# Patient Record
Sex: Male | Born: 1955 | Hispanic: No | Marital: Single
Health system: Southern US, Community
[De-identification: ages and names within clinical notes are randomized; demographics above are authoritative.]

---

## 1999-06-18 ENCOUNTER — Emergency Department (HOSPITAL_COMMUNITY): Admission: EM | Admit: 1999-06-18 | Discharge: 1999-06-18 | Payer: Self-pay | Admitting: Emergency Medicine

## 1999-06-18 ENCOUNTER — Encounter: Payer: Self-pay | Admitting: General Surgery

## 2000-01-30 ENCOUNTER — Encounter: Payer: Self-pay | Admitting: Emergency Medicine

## 2000-01-30 ENCOUNTER — Emergency Department (HOSPITAL_COMMUNITY): Admission: EM | Admit: 2000-01-30 | Discharge: 2000-01-30 | Payer: Self-pay | Admitting: Emergency Medicine

## 2003-09-30 ENCOUNTER — Emergency Department (HOSPITAL_COMMUNITY): Admission: EM | Admit: 2003-09-30 | Discharge: 2003-09-30 | Payer: Self-pay | Admitting: Emergency Medicine

## 2003-10-24 ENCOUNTER — Ambulatory Visit (HOSPITAL_COMMUNITY): Admission: RE | Admit: 2003-10-24 | Discharge: 2003-10-24 | Payer: Self-pay | Admitting: Surgery

## 2008-08-21 ENCOUNTER — Encounter: Admission: RE | Admit: 2008-08-21 | Discharge: 2008-08-21 | Payer: Self-pay | Admitting: Family Medicine

## 2009-02-19 ENCOUNTER — Encounter (INDEPENDENT_AMBULATORY_CARE_PROVIDER_SITE_OTHER): Payer: Self-pay | Admitting: General Surgery

## 2009-02-19 ENCOUNTER — Inpatient Hospital Stay (HOSPITAL_COMMUNITY): Admission: EM | Admit: 2009-02-19 | Discharge: 2009-02-22 | Payer: Self-pay | Admitting: Emergency Medicine

## 2010-10-12 LAB — GLUCOSE, CAPILLARY
Glucose-Capillary: 133 mg/dL — ABNORMAL HIGH (ref 70–99)
Glucose-Capillary: 141 mg/dL — ABNORMAL HIGH (ref 70–99)
Glucose-Capillary: 146 mg/dL — ABNORMAL HIGH (ref 70–99)
Glucose-Capillary: 147 mg/dL — ABNORMAL HIGH (ref 70–99)
Glucose-Capillary: 148 mg/dL — ABNORMAL HIGH (ref 70–99)
Glucose-Capillary: 166 mg/dL — ABNORMAL HIGH (ref 70–99)
Glucose-Capillary: 169 mg/dL — ABNORMAL HIGH (ref 70–99)

## 2010-10-12 LAB — CULTURE, BLOOD (ROUTINE X 2)
Culture: NO GROWTH
Culture: NO GROWTH

## 2010-10-12 LAB — URINALYSIS, ROUTINE W REFLEX MICROSCOPIC
Glucose, UA: 100 mg/dL — AB
Ketones, ur: 15 mg/dL — AB
Leukocytes, UA: NEGATIVE
Nitrite: NEGATIVE
Specific Gravity, Urine: 1.038 — ABNORMAL HIGH (ref 1.005–1.030)
pH: 7.5 (ref 5.0–8.0)

## 2010-10-12 LAB — URINE MICROSCOPIC-ADD ON

## 2010-10-12 LAB — BASIC METABOLIC PANEL
BUN: 10 mg/dL (ref 6–23)
CO2: 29 mEq/L (ref 19–32)
Chloride: 104 mEq/L (ref 96–112)
Creatinine, Ser: 1.09 mg/dL (ref 0.4–1.5)
Potassium: 3.6 mEq/L (ref 3.5–5.1)

## 2010-10-12 LAB — CBC
HCT: 34.7 % — ABNORMAL LOW (ref 39.0–52.0)
Hemoglobin: 11.6 g/dL — ABNORMAL LOW (ref 13.0–17.0)
MCHC: 34.9 g/dL (ref 30.0–36.0)
MCHC: 35.5 g/dL (ref 30.0–36.0)
MCV: 91.6 fL (ref 78.0–100.0)
MCV: 91.9 fL (ref 78.0–100.0)
Platelets: 100 10*3/uL — ABNORMAL LOW (ref 150–400)
RBC: 3.62 MIL/uL — ABNORMAL LOW (ref 4.22–5.81)
RBC: 3.78 MIL/uL — ABNORMAL LOW (ref 4.22–5.81)
RDW: 12.7 % (ref 11.5–15.5)
WBC: 5.2 10*3/uL (ref 4.0–10.5)

## 2010-10-13 LAB — CBC
HCT: 44.5 % (ref 39.0–52.0)
MCHC: 34.3 g/dL (ref 30.0–36.0)
MCV: 91.8 fL (ref 78.0–100.0)
Platelets: 161 10*3/uL (ref 150–400)
RDW: 13.2 % (ref 11.5–15.5)

## 2010-10-13 LAB — COMPREHENSIVE METABOLIC PANEL
AST: 25 U/L (ref 0–37)
Albumin: 3.8 g/dL (ref 3.5–5.2)
BUN: 10 mg/dL (ref 6–23)
Calcium: 9 mg/dL (ref 8.4–10.5)
Creatinine, Ser: 1.02 mg/dL (ref 0.4–1.5)
GFR calc Af Amer: >60 mL/min (ref >60)
Total Bilirubin: 2 mg/dL — ABNORMAL HIGH (ref 0.3–1.2)
Total Protein: 6.8 g/dL (ref 6.0–8.3)

## 2010-10-13 LAB — DIFFERENTIAL
Basophils Absolute: 0 10*3/uL (ref 0.0–0.1)
Lymphocytes Relative: 9 % — ABNORMAL LOW (ref 12–46)
Lymphs Abs: 0.8 10*3/uL (ref 0.7–4.0)
Monocytes Absolute: 0.5 10*3/uL (ref 0.1–1.0)
Monocytes Relative: 5 % (ref 3–12)
Neutro Abs: 8 10*3/uL — ABNORMAL HIGH (ref 1.7–7.7)

## 2010-11-19 NOTE — Op Note (Signed)
NAMEJAKEVION, ARNEY             ACCOUNT NO.:  [id number]   MEDICAL RECORD NO.:  [id number]          PATIENT TYPE:  INP   LOCATION:  0098                         FACILITY:  [facility]   PHYSICIAN:  Angelia Mould. Derrell Lolling, M.D.DATE OF BIRTH:  [birth year]   DATE OF PROCEDURE:  02/19/2009  DATE OF DISCHARGE:                               OPERATIVE REPORT   PREOPERATIVE DIAGNOSIS:  Acute appendicitis.   POSTOPERATIVE DIAGNOSIS:  Ruptured appendicitis with peritonitis.   OPERATION PERFORMED:  Laparoscopic appendectomy, extensive peritoneal  lavage.   SURGEON:  Dr. Claud Kelp.   OPERATIVE INDICATIONS:  This is a 55 year old Hispanic gentleman who  presents with a 36-hour history of abdominal pain, which has been  progressive in intensity and associated with some nausea and vomiting.  A physical exam shows diffuse abdominal tenderness to some degree but  maximal tenderness and guarding in the right lower quadrant.  CT scan  confirms an inflamed appendix and some thickening of the adjacent small  bowel loops and a little bit of free fluid.  He was started on broad  spectrum antibiotics and brought to operating room emergently.   OPERATIVE FINDINGS:  The patient had a ruptured appendix with  peritonitis.  The entire lower abdomen and pelvis had some exudate  around the liver and gallbladder and stomach and spleen.  There really  was not as much exudate but it was fairly extensive in the lower  abdomen.  I was able to separate the small bowel loops and identify the  appendix and I was able to visualize the anatomy to do so I could  perform a laparoscopic appendectomy.  There were no other gross  abnormalities.   OPERATIVE TECHNIQUE:  Following induction of general endotracheal  anesthesia, Foley catheter was inserted.  This had to be done carefully  as the patient had a hypospadias.   The abdomen and genitalia were then prepped and draped in sterile  fashion.  The patient had a previous  laparoscopic bilateral inguinal  hernia repair with mesh about 5 years ago.  He had an infraumbilical  incision.  We chose to make a vertically oriented incision above the  umbilicus.  The fascia was incised in the midline.  The abdominal cavity  entered under direct vision.  The 11 mm Hassan trocar was inserted and  secured with a pursestring suture of 0-0 Vicryl.  Pneumoperitoneum was  created.  The video camera was inserted with visualization findings as  described above.  A 12-mm trocar was placed in the left midabdomen just  below the umbilicus under direct vision.  This was placed carefully so  as to stay well above the mesh repair.  A 5-mm trocar was placed in the  right upper quadrant.   We spent some time irrigating things out and then we were able to  separate the terminal ileal loops away from the cecum.  We could see the  appendix adherent to the right pelvic sidewall.  We could lift this up  partially.  Actually I had to incise the peritoneum lateral to the cecum  and extend that peritoneal incision down across  the appendix and then I  could mobilize the cecum and the appendix medially.  I was then able to  take down the appendiceal mesentery with the harmonic scalpel.  I had  one arterial bleeder in the mesentery which was controlled nicely with  the harmonic scalpel.  I took down the appendiceal mesentery in small  steps until I had completely skeletonized the appendix and could clearly  visualize its junction with the base of the cecum.  A Endo-GIA stapler  was placed across the base of the appendix at the cecum, closed, held in  place for 30 minutes, fired and removed.  The staple line looked very  good was inspected several times.  The appendix was placed in the  specimen bag and removed.   I then spent about 20 minutes irrigating the subphrenic spaces,  abdominal spaces, paracolic gutters interloop spaces and pelvis.  I used  almost 5 liters of saline.  When this was  done, everything looked good.  He had a tiny bit of bleeding from the omentum in the left upper  quadrant but that had stopped by the time we finished the case.  We  evacuated all of the irrigation fluid.  The trocars were removed under  direct vision.  There was no bleeding from trocar sites.  Pneumoperitoneum was released.  Fascia at the umbilicus and the fascia  in the left lower quadrant trocar site were closed with 0-0 Vicryl  sutures.  The skin incisions were closed with subcuticular sutures of 4-  0 Monocryl and Steri-Strips.  Clean bandages were placed and the patient  taken to the recovery room in stable condition.  Estimated blood loss  was about 25-30 mL.  Complications none, sponge, needle and counts were  correct.      Angelia Mould. Derrell Lolling, M.D.  Electronically Signed     HMI/MEDQ  D:  02/19/2009  T:  02/19/2009  Job:  [id number]   cc:   Tally Joe, M.D.  Fax: [phone]

## 2010-11-19 NOTE — H&P (Signed)
Jeffrey Oconnell, Jeffrey Oconnell             ACCOUNT NO.:  [id number]   MEDICAL RECORD NO.:  [id number]          PATIENT TYPE:  EMS   LOCATION:  ED                           FACILITY:  [facility]   PHYSICIAN:  Angelia Mould. Derrell Lolling, M.D.DATE OF BIRTH:  [birth year]   DATE OF ADMISSION:  02/18/2009  DATE OF DISCHARGE:                              HISTORY & PHYSICAL   CHIEF COMPLAINT:  Abdominal pain.   HISTORY OF PRESENT ILLNESS:  This is a 55 year old Hispanic gentleman in  reasonably good health.  On Saturday, February 17, 2009, at approximately  5 o'clock p.m. he noted the onset of diffuse abdominal pain.  This has  been progressive in intensity and is now more localized to the right  lower quadrant.  He vomited 4 times on Saturday, February 17, 2009, but  has not vomited in the last 24 hours.  He denies fever, chills, or  diarrhea.  He denies prior problems.   He was seen at [facility] on Sunday.  No specific diagnosis was  given, according to the patient.  He was given a prescription for Cipro  and Flagyl.  When the pain got worse he came to the [facility]  Emergency Department.   In the emergency department, a CT scan was performed which shows a  thickened inflamed appendix with a fecalith present and a little bit of  free fluid.  Some small metal tacks were noted in the abdominal wall,  suggesting prior surgery.  He is being admitted for treatment of  appendicitis.   PAST HISTORY:  1. Diabetes mellitus type 2.  2. Laparoscopic bilateral inguinal hernia repair in 2005 in      [place].   Otherwise no medical or surgical problems.   CURRENT MEDICATIONS:  Metformin.   DRUG ALLERGIES:  None known.   SOCIAL HISTORY:  The patient is married, has 5 children.  His wife is  with him tonight.  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  He smokes one-half pack of cigarettes per  day.  Drinks 3 alcoholic beverages a week.   FAMILY HISTORY:  Mother  living and well.  Father deceased of cancer of  the pancreas.   REVIEW OF SYSTEMS:  Ten-system review of systems is performed and is  noncontributory except as described above.   PHYSICAL EXAMINATION:  GENERAL:  Pleasant Hispanic gentleman, who speaks  English well, in mild to moderate distress, cooperative.  VITAL SIGNS:  Temp 98.2, pulse 79, respirations 24, blood pressure  121/80.  EYES:  Sclerae are clear.  Extraocular movements intact.  EARS, NOSE, MOUTH, AND THROAT:  Nose, lips, tongue, and oropharynx are  without gross lesions.  NECK:  Supple, nontender.  No mass.  No jugular venous distention.  LUNGS:  Clear to auscultation.  No chest wall tenderness.  HEART:  Regular rate and rhythm.  No murmur.  Radial and femoral pulses  are palpable.  No peripheral edema.  ABDOMEN:  There is a small transverse scar at the lower end of the  umbilicus.  The abdomen is not obviously distended.  There is mild  diffuse tenderness, but there is more significant tenderness with  guarding and easily elicited percussion tenderness in the right lower  quadrant.  There is no mass.  I do not see any other scars.  There is no  hernia in the abdomen.  GENITOURINARY:  Penis, scrotum, and testes are normal.  There is no  inguinal mass or hernia.  EXTREMITIES:  He moves all 4 extremities well without pain or deformity.  NEUROLOGIC:  No gross motor or sensory deficits.   ADMISSION DATA:  CT scan suggests acute appendicitis with fecalith.  A  small amount of free fluid, a little bit of inflammation of the terminal  ileum, thought to be secondary to the appendicitis.  Effects from  previous hernia repair.  Hemoglobin 15.3, white blood cell count 9300,  but with a left shift.  Glucose 219, potassium 3.4, creatinine 1.02.   ASSESSMENT:  1. Acute appendicitis, possibly early rupture.  2. Status post laparoscopic bilateral inguinal hernia repair with      mesh.  3. Diabetes mellitus type 2.   PLAN:  1.  The patient will be admitted to the hospital, started on IV      antibiotics, and will be taken to the operating room for      appendectomy.  2. We will intend to do this laparoscopically, although there is      increased risk he may have to be converted to open because of his      previous surgery.  3. I have discussed the indications and details of surgery with the      patient and his wife.  Risks and complications have been outlined,      including but not limited to bleeding, infection, conversion to      open laparotomy, injury to adjacent organs such as the intestine or      bladder with major reconstructive surgery, wound problems, cardiac,      pulmonary, and thromboembolic problems.  He seems to understand      these issues well.  At this time, all of his questions are      answered.  He is in full agreement with this plan.      Angelia Mould. Derrell Lolling, M.D.  Electronically Signed     HMI/MEDQ  D:  02/19/2009  T:  02/19/2009  Job:  [id number]   cc:   Tally Joe, M.D.  Fax: [phone]   [facility]

## 2010-11-22 NOTE — Op Note (Signed)
NAMEANTINIO, SANDERFER                         ACCOUNT NO.:  [id number]   MEDICAL RECORD NO.:  [id number]                   PATIENT TYPE:  AMB   LOCATION:  DAY                                  FACILITY:  [facility]   PHYSICIAN:  Abigail Miyamoto, M.D.              DATE OF BIRTH:  07-06-1956   DATE OF PROCEDURE:  10/24/2003  DATE OF DISCHARGE:                                 OPERATIVE REPORT   PREOPERATIVE DIAGNOSIS:  Bilateral inguinal hernias.   POSTOPERATIVE DIAGNOSIS:  Bilateral inguinal hernias.   PROCEDURE:  Bilateral laparoscopic inguinal hernia repair with mesh.   SURGEON:  Abigail Miyamoto, M.D.   ANESTHESIA:  General endotracheal anesthesia with 0.25% Marcaine.   ESTIMATED BLOOD LOSS:  Minimal.   PROCEDURE IN DETAIL:  Patient is brought to the operating room and  identified as Renard Hamper.  He is placed supine on the operating room  table, and general anesthesia was induced.  His abdomen was then prepped and  draped in the usual sterile fashion.  Using a #15 blade, a small transverse  incision is made below the umbilicus and carried down to the fascia, which  was then opened in the midline.  The rectus muscle was then identified and  elevated.  The dissecting balloon was then passed underneath the rectus  muscle and manipulated towards the pubis.  The dissecting balloon was then  insufflated under direct vision, dissecting out the pre-peritoneal space.  Good dissection appeared to be achieved.  The dissecting balloon was then  removed, and a small balloon port was placed at the umbilicus, and then  insufflation was begun with CO2.  Two 5 mm ports were then placed in the  patient's midline under direct vision.  Cooper's ligament and the pelvic rim  were then easily identified.  The right side was then dissected first where  the patient was found to have a large, direct hernia, which was easily  reduced with a sac.  There was a question of a small indirect inguinal  hernia sac, and the peritoneum was dissected free and a lipoma was released  from the testicular cord.  Dissection laterally was also easily achieved.  During this, the peritoneum appeared to be opened, so a Varies needle was  inserted through a small incision in the right side of the abdomen.  Dissection was then carried out on the left side of the abdomen.  Patient  had a smaller indirect hernia defect on this side with the sac easily  reduced as well.  A larger lipoma within the testicular cord on this side  and was reduced as well.  No evidence of indirect hernia defect was  identified on this side.  At this point, two pieces of precut Prolene mesh  were brought onto the field.  These were concave pieces of mesh.  A Vicryl  stitch was placed in each piece of mesh.  The left-sided mesh was placed  through the port first and opened up in an on-laid fashion over the right  inguinal area.  It was tacked in place to Cooper's ligament up the medial  wall and out laterally.  Excellent coverage of the defect and testicular  cord appeared to be achieved.  The second piece of the mesh was then placed  through the port of the umbilicus.  It likewise was opened up over the right  inguinal area.  It was likewise tacked to Cooper's ligament up the medial  abdominal wall and out laterally.  Again, good coverage of the large hernia  defect on this side also appeared to be achieved.  At this point, both  pieces of mesh were examined and found to be covering the inguinal floor as  well.  The midline ports were then removed, and the pre-peritoneal space was  collapsed.  This space appeared to collapse appropriately.  At this point,  the Varies needle was also removed.  All ports were then removed at the  umbilicus as well.  The peritoneum was opened at the umbilicus to allow the  rest of the area of the peritoneal cavity be released.  A 0 Vicryl purse-  string suture was then used to close the fascial  defect at the umbilicus.  All wounds were anesthetized with 0.25% Marcaine.  Ilioinguinal nerve blocks  were performed with the  Marcaine as well.  All incisions were closed with 4-0 Monocryl subcuticular  sutures.  Steri-Strips, gauze, and tape were then applied.  The patient  tolerated the procedure well.  All counts were correct at the end of the  procedure.  The patient was then extubated in the operating room and taken  in stable condition to the recovery room.                                               Abigail Miyamoto, M.D.    DB/MEDQ  D:  10/24/2003  T:  10/24/2003  Job:  [id number]

## 2010-11-22 NOTE — Discharge Summary (Signed)
Jeffrey Oconnell, Jeffrey Oconnell             ACCOUNT NO.:  [id number]   MEDICAL RECORD NO.:  [id number]          PATIENT TYPE:  INP   LOCATION:  1508                         FACILITY:  [facility]   PHYSICIAN:  Angelia Mould. Derrell Lolling, M.D.DATE OF BIRTH:  May 12, 1956   DATE OF ADMISSION:  02/18/2009  DATE OF DISCHARGE:  02/22/2009                               DISCHARGE SUMMARY   FINAL DIAGNOSES:  1. Ruptured appendicitis with peritonitis.  2. Diabetes mellitus type 2.  3. History of laparoscopic bilateral inguinal hernia repair with mesh.   OPERATION PERFORMED:  Laparoscopic appendectomy and extensive peritoneal  lavage.   HISTORY:  This is a 55 year old Hispanic gentleman who had the onset of  abdominal pain on Saturday, February 17, 2009.  He vomited several times.  He noted the pain to be more localized to the right lower quadrant.  He  was evaluated by an outside physician and then eventually came to [facility] Emergency Department where a CT scan showed a thickened inflamed  appendix with a fecalith present and a little bit of free fluid.  Metal  tacks were also noted in the abdominal wall consistent with his prior  inguinal hernia repairs.   PHYSICAL EXAMINATION:  The patient was a pleasant middle-aged gentleman  in moderate distress.  Significant physical findings revealed that the  abdomen showed a small transverse scar at the lower end of the  umbilicus, mild diffuse tenderness but more significant tenderness and  guarding in the right lower quadrant and to a lesser extent in the left  lower quadrant.   ADMISSION DATA:  CT scan described above.  White blood cell count 9300  but with a left shift.  Glucose 219, potassium 3.4, creatinine 1.02.   HOSPITAL COURSE:  On the day of admission, the patient was taken to the  operating room where he underwent diagnostic laparoscopy and  laparoscopic appendectomy.  I found that he had a ruptured appendix and  had extensive purulent fluid throughout  the abdomen and pelvis and  subphrenic spaces.  After removing the appendix, I performed an  extensive peritoneal lavage with numerous Liters of saline until the  fluid was completely clear.   Postoperatively, the patient did reasonably well.  He had an ileus for a  couple of days which was expected.  By postop day #3, February 21, 2009,  he was feeling better, ambulating, had started passing some flatus, and  so we began  to advance his diet at that point.  On February 22, 2009, he was doing  well, was tolerating a regular diet and wanted to go home.  He was  discharged at that time.  He was given prescriptions for Cipro and  Flagyl for another 7 days.  He was given prescription for Vicodin.  He  was asked to return to see me in the office in 3 weeks.      Angelia Mould. Derrell Lolling, M.D.  Electronically Signed     HMI/MEDQ  D:  03/06/2009  T:  03/06/2009  Job:  [id number]   cc:   Tally Joe, M.D.  Fax: [phone]   [facility], [address]

## 2011-09-26 ENCOUNTER — Other Ambulatory Visit: Payer: Self-pay | Admitting: Gastroenterology

## 2017-03-13 DIAGNOSIS — Z23 Encounter for immunization: Secondary | ICD-10-CM | POA: Diagnosis not present

## 2017-03-13 DIAGNOSIS — I10 Essential (primary) hypertension: Secondary | ICD-10-CM | POA: Diagnosis not present

## 2017-03-13 DIAGNOSIS — E1165 Type 2 diabetes mellitus with hyperglycemia: Secondary | ICD-10-CM | POA: Diagnosis not present

## 2017-03-13 DIAGNOSIS — E782 Mixed hyperlipidemia: Secondary | ICD-10-CM | POA: Diagnosis not present

## 2017-07-17 DIAGNOSIS — I10 Essential (primary) hypertension: Secondary | ICD-10-CM | POA: Diagnosis not present

## 2017-07-17 DIAGNOSIS — E1165 Type 2 diabetes mellitus with hyperglycemia: Secondary | ICD-10-CM | POA: Diagnosis not present

## 2017-07-17 DIAGNOSIS — E782 Mixed hyperlipidemia: Secondary | ICD-10-CM | POA: Diagnosis not present

## 2017-10-02 DIAGNOSIS — T7840XA Allergy, unspecified, initial encounter: Secondary | ICD-10-CM | POA: Diagnosis not present

## 2018-02-26 DIAGNOSIS — Z0000 Encounter for general adult medical examination without abnormal findings: Secondary | ICD-10-CM | POA: Diagnosis not present

## 2018-02-26 DIAGNOSIS — E782 Mixed hyperlipidemia: Secondary | ICD-10-CM | POA: Diagnosis not present

## 2018-02-26 DIAGNOSIS — I10 Essential (primary) hypertension: Secondary | ICD-10-CM | POA: Diagnosis not present

## 2018-02-26 DIAGNOSIS — E1169 Type 2 diabetes mellitus with other specified complication: Secondary | ICD-10-CM | POA: Diagnosis not present

## 2018-09-03 DIAGNOSIS — I10 Essential (primary) hypertension: Secondary | ICD-10-CM | POA: Diagnosis not present

## 2018-09-03 DIAGNOSIS — E782 Mixed hyperlipidemia: Secondary | ICD-10-CM | POA: Diagnosis not present

## 2018-09-03 DIAGNOSIS — E1169 Type 2 diabetes mellitus with other specified complication: Secondary | ICD-10-CM | POA: Diagnosis not present

## 2018-09-10 DIAGNOSIS — N486 Induration penis plastica: Secondary | ICD-10-CM | POA: Diagnosis not present

## 2019-12-07 ENCOUNTER — Other Ambulatory Visit: Payer: Self-pay | Admitting: *Deleted

## 2019-12-07 NOTE — Patient Outreach (Signed)
[facility]) Care Management  12/07/2019  Jeffrey Oconnell 17-Jun-1956 [id number]   Subjective: Telephone call to patient's home / mobile number, spoke with patient, he stated his name, currently in a meeting, and requested call back at a later time.    Objective: Per KPN (Knowledge Performance Now, point of care tool) and chart review, patient has had no recent hospitalizations or ED visits.  Patient has a history of diabetes and hypertension.        Assessment: Received [employer] referral on 11/16/2019.  Referral source: [employer] at [facility] Care Management.  Referral reason: They are the top 10 high cost members for the [facility] commercial plan and need to be engaged.    Screening  follow up pending patient contact.     Plan:  RNCM will send unsuccessful outreach  letter, [facility] pamphlet, will call patient for 2nd telephone outreach attempt within 4 business days, screening follow up, and will proceed with case closure within 10 business days if no return call, after 4th unsuccessful outreach call.     Tammi Boulier H. Gardiner Barefoot, BSN, CCM [facility] Care Management [facility] Telephonic CM Phone: [phone] Fax: [phone]

## 2019-12-09 ENCOUNTER — Other Ambulatory Visit: Payer: Self-pay | Admitting: *Deleted

## 2019-12-09 NOTE — Patient Outreach (Addendum)
[facility]) Care Management  12/09/2019  Jeffrey Oconnell 01-Jul-1956 [id number]   Subjective: Telephone call to patient's home  / mobile number, spoke with male, states Jeffrey Oconnell  is not available, left HIPAA compliant message for Jeffrey Oconnell, and requested call back.   Objective: Per KPN (Knowledge Performance Now, point of care tool) and chart review, patient has had no recent hospitalizations or ED visits.  Patient has a history of diabetes and hypertension.        Assessment: Received [employer] referral on 11/16/2019.  Referral source: [employer] at [facility] Care Management.  Referral reason: They are the top 10 high cost members for the [facility] commercial plan and need to be engaged.    Screening  follow up pending patient contact.     Plan:  RNCM has sent unsuccessful outreach  letter, [facility] pamphlet, will call patient for 3rd telephone outreach attempt within 4 business days, screening follow up, and will proceed with case closure within 10 business days if no return call, after 4th unsuccessful outreach call.     Vahe Pienta H. Gardiner Barefoot, BSN, CCM [facility] Care Management [facility] Telephonic CM Phone: [phone] Fax: [phone]

## 2019-12-14 ENCOUNTER — Other Ambulatory Visit: Payer: Self-pay | Admitting: *Deleted

## 2019-12-14 NOTE — Patient Outreach (Signed)
[facility]) Care Management  12/14/2019  Jeffrey Oconnell 03/26/1956 [id number]   Subjective: Telephone call to patient's home  / mobile number, no answer, left HIPAA compliant message for Jeffrey Oconnell, and requested call back.   Objective:Per KPN (Knowledge Performance Now, point of care tool) and chart review,patient has had no recent hospitalizations or ED visits. Patient has a history of diabetes and hypertension.      Assessment: Received [employer] referral on 11/16/2019. Referral source: Jeffrey Oconnell at [facility] Care Management. Referral reason: They are the top 10 high cost members for the [facility] commercial plan and need to be engaged.Screening follow up pending patient contact.     Plan:RNCM has sent unsuccessful outreach letter, Jeffrey Oconnell pamphlet, will call patient for 4th telephone outreach attempt within 30 business days, screening follow up, and will proceed with case closure, if no return call, after 4th unsuccessful outreach call.      Jeffrey Oconnell H. Gardiner Barefoot, BSN, CCM [facility] Care Management [facility] Telephonic CM Phone: [phone] Fax: [phone]

## 2020-01-11 ENCOUNTER — Other Ambulatory Visit: Payer: Self-pay | Admitting: *Deleted

## 2020-01-11 NOTE — Patient Outreach (Addendum)
[facility]) Care Management  01/11/2020  Jeffrey Oconnell 22-Oct-1955 [id number]   Subjective: Telephone call to patient's home / mobile number, spoke with patient, stated name, address, and decline to state address.   Patient decline to further HIPAA verification, states not interested, and  he does not have time to discuss. Caller advised patient have sent letter regarding nature of call and requested call back with any questions.     Objective:Per KPN (Knowledge Performance Now, point of care tool) and chart review,patient has had no recent hospitalizations or ED visits. Patient has a history of diabetes and hypertension.      Assessment: Received [employer] referral on 11/16/2019. Referral source: [employer] at [facility] Care Management. Referral reason: They are the top 10 high cost members for the [facility] commercial plan and need to be engaged.Screening follow up not completed due to patient refusing services and will proceed with case closure.     Plan:RNCM will close case due to patient declining [facility] Care Management services.      Soledad Budreau H. Gardiner Barefoot, BSN, CCM [facility] Care Management [facility] Telephonic CM Phone: [phone] Fax: [phone]

## 2021-10-18 ENCOUNTER — Other Ambulatory Visit: Payer: Self-pay | Admitting: Family Medicine

## 2021-10-18 DIAGNOSIS — Z23 Encounter for immunization: Secondary | ICD-10-CM | POA: Diagnosis not present

## 2021-10-18 DIAGNOSIS — Z794 Long term (current) use of insulin: Secondary | ICD-10-CM | POA: Diagnosis not present

## 2021-10-18 DIAGNOSIS — Z1159 Encounter for screening for other viral diseases: Secondary | ICD-10-CM | POA: Diagnosis not present

## 2021-10-18 DIAGNOSIS — Z7984 Long term (current) use of oral hypoglycemic drugs: Secondary | ICD-10-CM | POA: Diagnosis not present

## 2021-10-18 DIAGNOSIS — Z136 Encounter for screening for cardiovascular disorders: Secondary | ICD-10-CM | POA: Diagnosis not present

## 2021-10-18 DIAGNOSIS — Z0000 Encounter for general adult medical examination without abnormal findings: Secondary | ICD-10-CM | POA: Diagnosis not present

## 2021-10-18 DIAGNOSIS — E782 Mixed hyperlipidemia: Secondary | ICD-10-CM | POA: Diagnosis not present

## 2021-10-18 DIAGNOSIS — E1169 Type 2 diabetes mellitus with other specified complication: Secondary | ICD-10-CM | POA: Diagnosis not present

## 2021-10-18 DIAGNOSIS — I10 Essential (primary) hypertension: Secondary | ICD-10-CM | POA: Diagnosis not present

## 2021-10-18 DIAGNOSIS — Z1389 Encounter for screening for other disorder: Secondary | ICD-10-CM | POA: Diagnosis not present

## 2021-10-18 DIAGNOSIS — Z125 Encounter for screening for malignant neoplasm of prostate: Secondary | ICD-10-CM | POA: Diagnosis not present

## 2021-10-18 DIAGNOSIS — N529 Male erectile dysfunction, unspecified: Secondary | ICD-10-CM | POA: Diagnosis not present

## 2021-11-08 ENCOUNTER — Ambulatory Visit
Admission: RE | Admit: 2021-11-08 | Discharge: 2021-11-08 | Disposition: A | Discharge status: Home / Self Care | Payer: PPO | Source: Ambulatory Visit | Attending: Family Medicine | Admitting: Family Medicine

## 2021-11-08 DIAGNOSIS — Z87891 Personal history of nicotine dependence: Secondary | ICD-10-CM | POA: Diagnosis not present

## 2021-11-08 DIAGNOSIS — Z136 Encounter for screening for cardiovascular disorders: Secondary | ICD-10-CM

## 2022-04-18 DIAGNOSIS — Z23 Encounter for immunization: Secondary | ICD-10-CM | POA: Diagnosis not present

## 2022-04-18 DIAGNOSIS — I10 Essential (primary) hypertension: Secondary | ICD-10-CM | POA: Diagnosis not present

## 2022-04-18 DIAGNOSIS — E1169 Type 2 diabetes mellitus with other specified complication: Secondary | ICD-10-CM | POA: Diagnosis not present

## 2022-04-18 DIAGNOSIS — E782 Mixed hyperlipidemia: Secondary | ICD-10-CM | POA: Diagnosis not present

## 2022-04-18 DIAGNOSIS — N529 Male erectile dysfunction, unspecified: Secondary | ICD-10-CM | POA: Diagnosis not present

## 2022-08-01 DIAGNOSIS — E1165 Type 2 diabetes mellitus with hyperglycemia: Secondary | ICD-10-CM | POA: Diagnosis not present

## 2022-08-01 DIAGNOSIS — E559 Vitamin D deficiency, unspecified: Secondary | ICD-10-CM | POA: Diagnosis not present

## 2022-08-01 DIAGNOSIS — E782 Mixed hyperlipidemia: Secondary | ICD-10-CM | POA: Diagnosis not present

## 2022-08-01 DIAGNOSIS — I10 Essential (primary) hypertension: Secondary | ICD-10-CM | POA: Diagnosis not present

## 2022-08-01 DIAGNOSIS — R809 Proteinuria, unspecified: Secondary | ICD-10-CM | POA: Diagnosis not present

## 2022-11-24 DIAGNOSIS — I10 Essential (primary) hypertension: Secondary | ICD-10-CM | POA: Diagnosis not present

## 2022-11-24 DIAGNOSIS — N529 Male erectile dysfunction, unspecified: Secondary | ICD-10-CM | POA: Diagnosis not present

## 2022-11-24 DIAGNOSIS — Z0000 Encounter for general adult medical examination without abnormal findings: Secondary | ICD-10-CM | POA: Diagnosis not present

## 2022-11-24 DIAGNOSIS — Z1211 Encounter for screening for malignant neoplasm of colon: Secondary | ICD-10-CM | POA: Diagnosis not present

## 2022-11-24 DIAGNOSIS — E119 Type 2 diabetes mellitus without complications: Secondary | ICD-10-CM | POA: Diagnosis not present

## 2022-11-24 DIAGNOSIS — Z1331 Encounter for screening for depression: Secondary | ICD-10-CM | POA: Diagnosis not present

## 2022-11-24 DIAGNOSIS — Z125 Encounter for screening for malignant neoplasm of prostate: Secondary | ICD-10-CM | POA: Diagnosis not present

## 2022-11-24 DIAGNOSIS — E782 Mixed hyperlipidemia: Secondary | ICD-10-CM | POA: Diagnosis not present

## 2022-11-24 DIAGNOSIS — Z634 Disappearance and death of family member: Secondary | ICD-10-CM | POA: Diagnosis not present

## 2022-11-24 DIAGNOSIS — E559 Vitamin D deficiency, unspecified: Secondary | ICD-10-CM | POA: Diagnosis not present

## 2022-12-05 DIAGNOSIS — Z125 Encounter for screening for malignant neoplasm of prostate: Secondary | ICD-10-CM | POA: Diagnosis not present

## 2022-12-05 DIAGNOSIS — E782 Mixed hyperlipidemia: Secondary | ICD-10-CM | POA: Diagnosis not present

## 2022-12-05 DIAGNOSIS — Z23 Encounter for immunization: Secondary | ICD-10-CM | POA: Diagnosis not present

## 2022-12-05 DIAGNOSIS — E559 Vitamin D deficiency, unspecified: Secondary | ICD-10-CM | POA: Diagnosis not present

## 2023-05-29 DIAGNOSIS — Z23 Encounter for immunization: Secondary | ICD-10-CM | POA: Diagnosis not present

## 2023-05-29 DIAGNOSIS — E559 Vitamin D deficiency, unspecified: Secondary | ICD-10-CM | POA: Diagnosis not present

## 2023-05-29 DIAGNOSIS — N529 Male erectile dysfunction, unspecified: Secondary | ICD-10-CM | POA: Diagnosis not present

## 2023-05-29 DIAGNOSIS — E782 Mixed hyperlipidemia: Secondary | ICD-10-CM | POA: Diagnosis not present

## 2023-05-29 DIAGNOSIS — E119 Type 2 diabetes mellitus without complications: Secondary | ICD-10-CM | POA: Diagnosis not present

## 2023-05-29 DIAGNOSIS — E1169 Type 2 diabetes mellitus with other specified complication: Secondary | ICD-10-CM | POA: Diagnosis not present

## 2023-05-29 DIAGNOSIS — I10 Essential (primary) hypertension: Secondary | ICD-10-CM | POA: Diagnosis not present

## 2023-06-09 DIAGNOSIS — I10 Essential (primary) hypertension: Secondary | ICD-10-CM | POA: Diagnosis not present

## 2023-06-09 DIAGNOSIS — E559 Vitamin D deficiency, unspecified: Secondary | ICD-10-CM | POA: Diagnosis not present

## 2023-06-09 DIAGNOSIS — E1165 Type 2 diabetes mellitus with hyperglycemia: Secondary | ICD-10-CM | POA: Diagnosis not present

## 2023-06-09 DIAGNOSIS — R809 Proteinuria, unspecified: Secondary | ICD-10-CM | POA: Diagnosis not present

## 2023-09-04 DIAGNOSIS — E1165 Type 2 diabetes mellitus with hyperglycemia: Secondary | ICD-10-CM | POA: Diagnosis not present

## 2023-10-13 IMAGING — US US ABDOMINAL AORTA SCREENING AAA
1 series · 14 of 25 positions shown · non-contrast
Comparison: None Available.

CLINICAL DATA: Male between 65-75 years of age with a smoking
history.

EXAM:
US ABDOMINAL AORTA MEDICARE SCREENING
TECHNIQUE: Ultrasound examination of the abdominal aorta was performed as a
screening evaluation for abdominal aortic aneurysm.

[Series 1: us abdominal aorta screening aaa · 0.30mm/px · 14 of 25 slices shown]
[im 1/25]
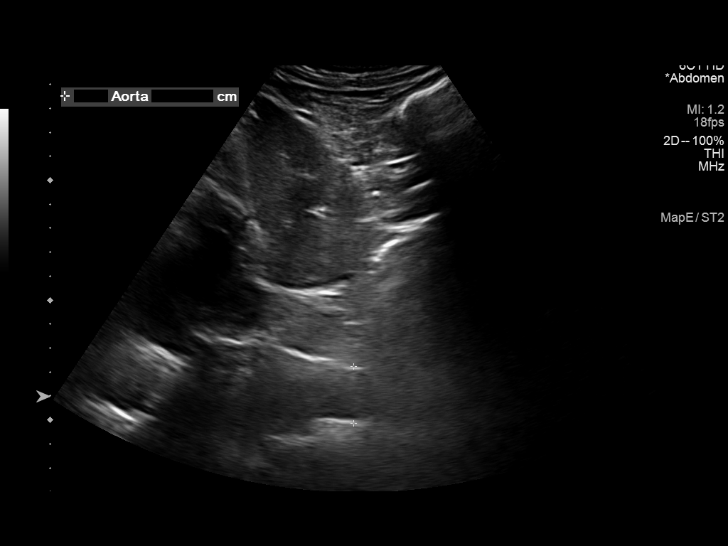
[im 3/25]
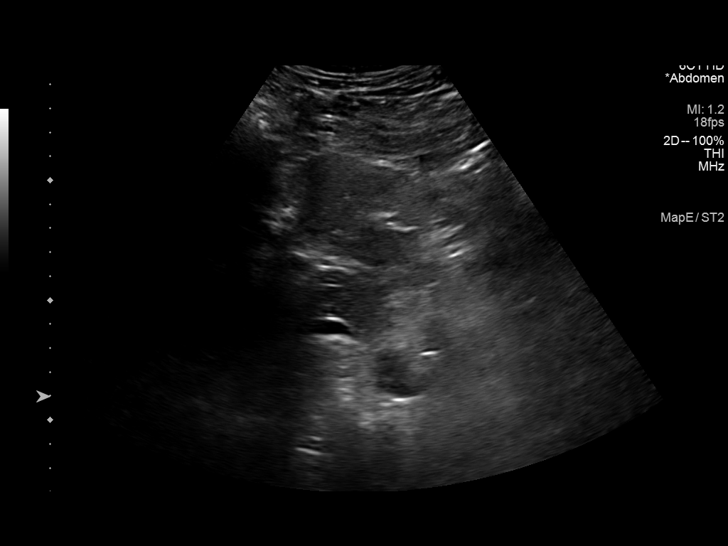
[im 5/25]
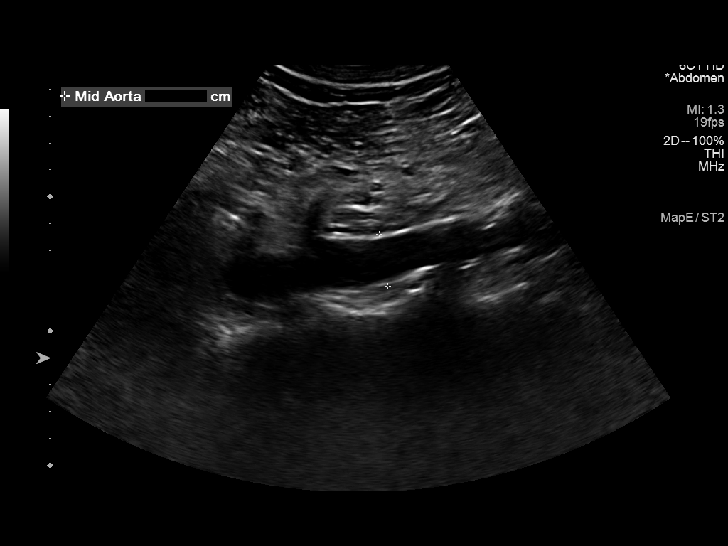
[im 7/25]
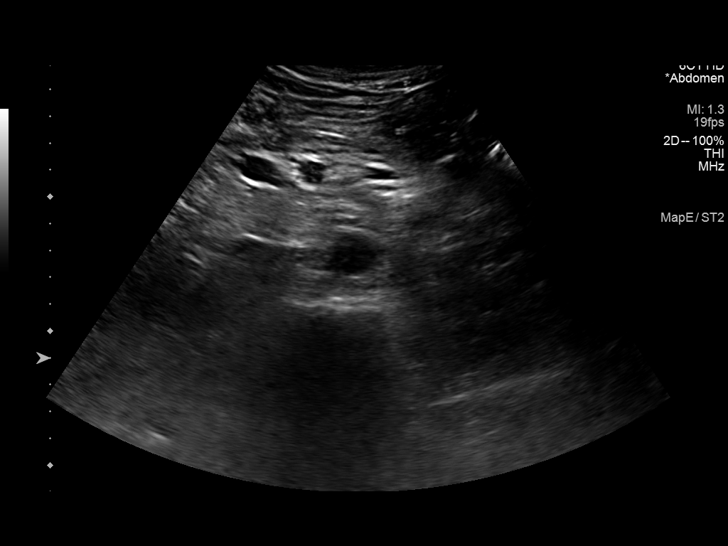
[im 9/25]
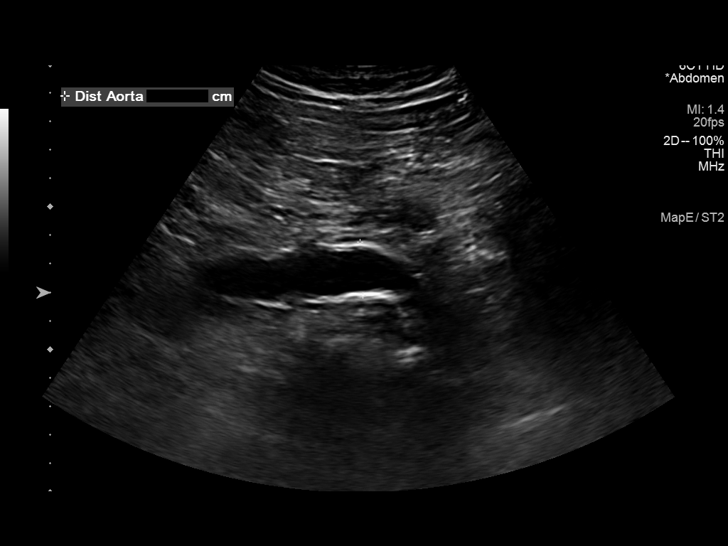
[im 10/25]
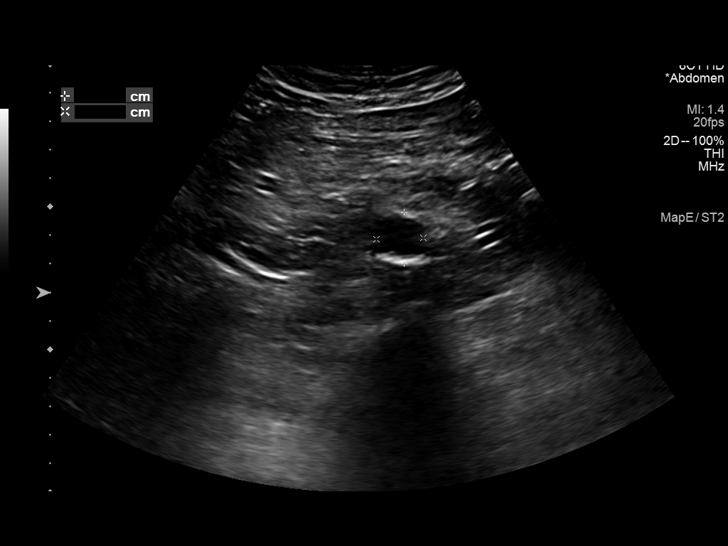
[im 12/25]
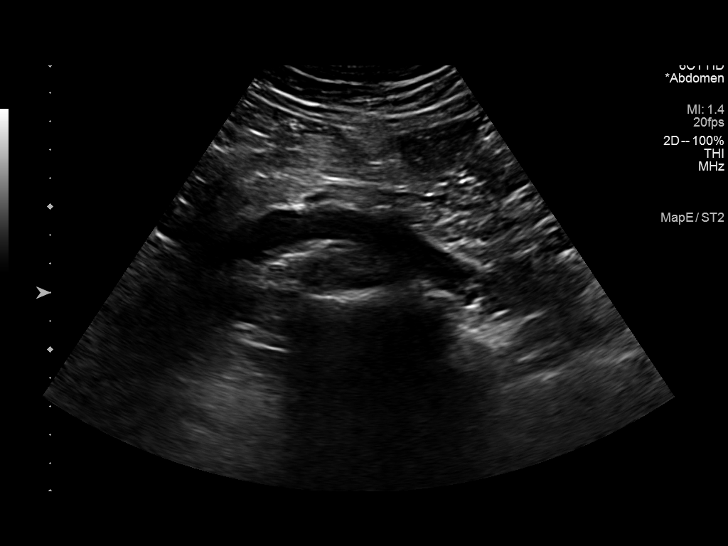
[im 14/25]
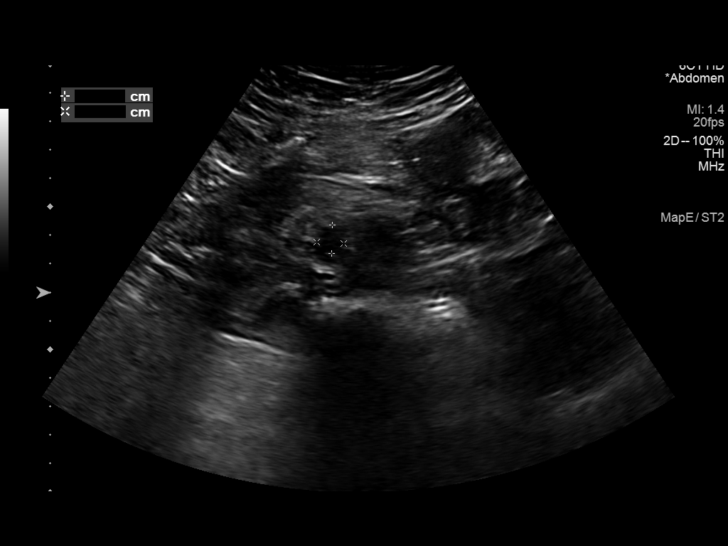
[im 16/25]
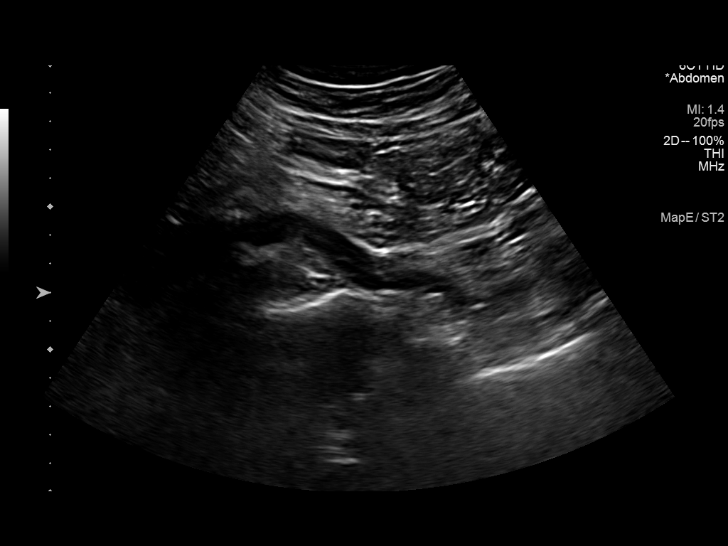
[im 17/25]
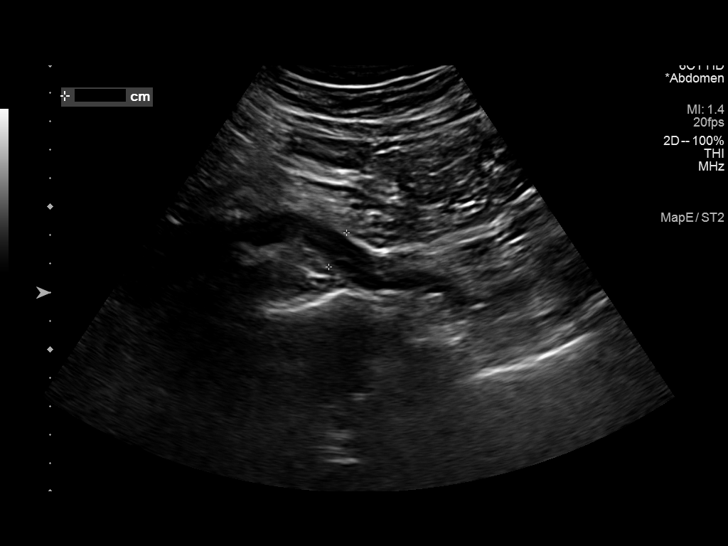
[im 19/25]
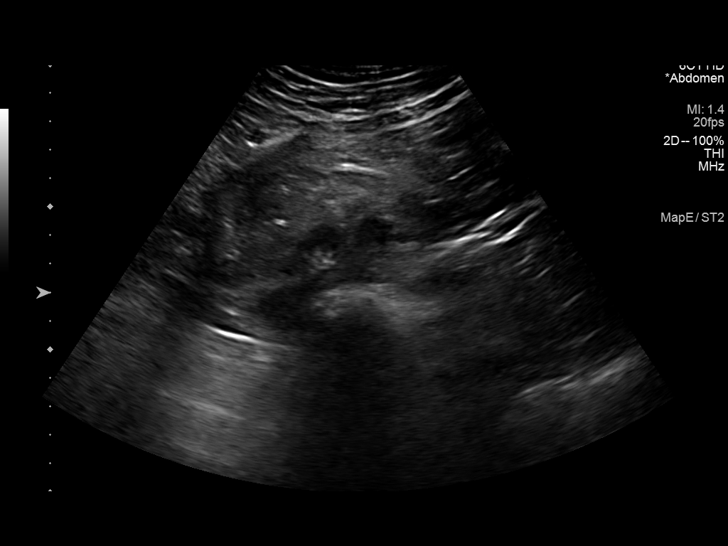
[im 21/25]
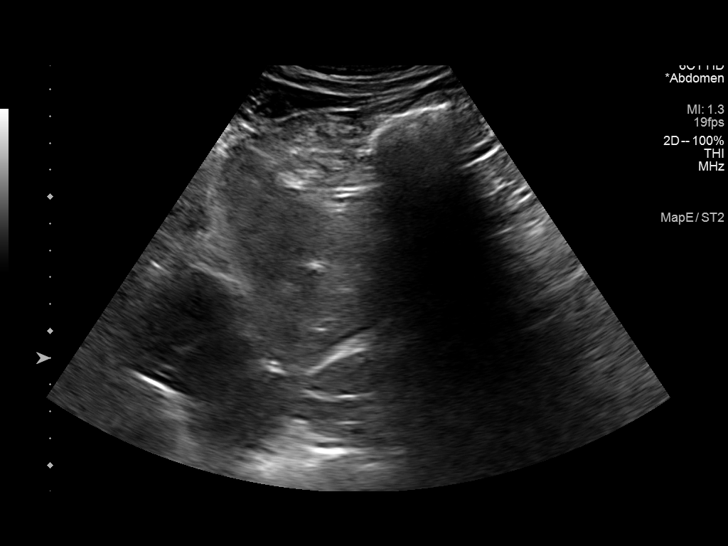
[im 23/25]
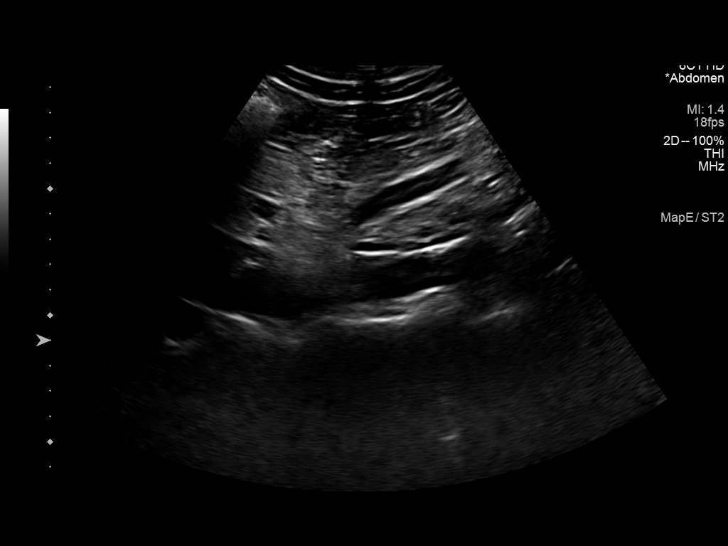
[im 25/25]
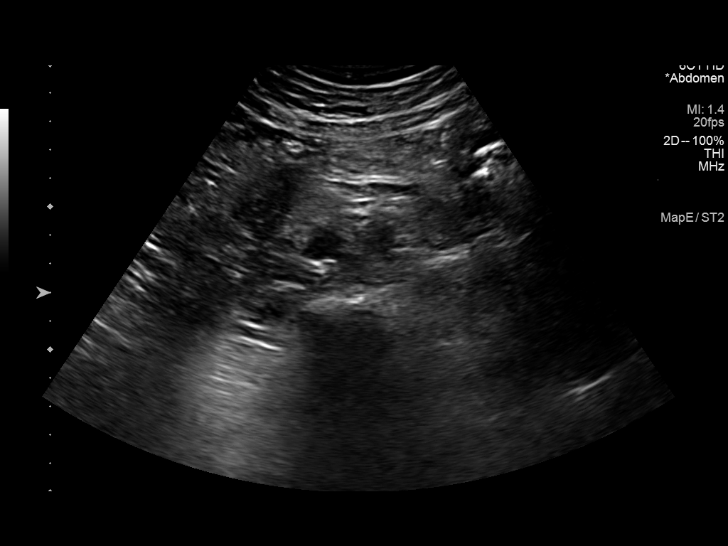

[14 of 25 positions shown; findings below may reference images not displayed]

FINDINGS: Abdominal aortic measurements as follows:

Proximal:  2.4 x 2.4 cm

Mid:  2.0 x 1.8 cm

Distal:  2.2 x 1.9 cm
IMPRESSION: No findings of abdominal aortic aneurysm.

## 2023-11-27 DIAGNOSIS — I10 Essential (primary) hypertension: Secondary | ICD-10-CM | POA: Diagnosis not present

## 2023-11-27 DIAGNOSIS — R809 Proteinuria, unspecified: Secondary | ICD-10-CM | POA: Diagnosis not present

## 2023-11-27 DIAGNOSIS — E782 Mixed hyperlipidemia: Secondary | ICD-10-CM | POA: Diagnosis not present

## 2023-11-27 DIAGNOSIS — E559 Vitamin D deficiency, unspecified: Secondary | ICD-10-CM | POA: Diagnosis not present

## 2023-11-27 DIAGNOSIS — E1165 Type 2 diabetes mellitus with hyperglycemia: Secondary | ICD-10-CM | POA: Diagnosis not present

## 2023-12-11 DIAGNOSIS — E559 Vitamin D deficiency, unspecified: Secondary | ICD-10-CM | POA: Diagnosis not present

## 2023-12-11 DIAGNOSIS — Z1211 Encounter for screening for malignant neoplasm of colon: Secondary | ICD-10-CM | POA: Diagnosis not present

## 2023-12-11 DIAGNOSIS — N529 Male erectile dysfunction, unspecified: Secondary | ICD-10-CM | POA: Diagnosis not present

## 2023-12-11 DIAGNOSIS — I10 Essential (primary) hypertension: Secondary | ICD-10-CM | POA: Diagnosis not present

## 2023-12-11 DIAGNOSIS — E1169 Type 2 diabetes mellitus with other specified complication: Secondary | ICD-10-CM | POA: Diagnosis not present

## 2023-12-11 DIAGNOSIS — Z125 Encounter for screening for malignant neoplasm of prostate: Secondary | ICD-10-CM | POA: Diagnosis not present

## 2023-12-11 DIAGNOSIS — Z23 Encounter for immunization: Secondary | ICD-10-CM | POA: Diagnosis not present

## 2023-12-11 DIAGNOSIS — Z1331 Encounter for screening for depression: Secondary | ICD-10-CM | POA: Diagnosis not present

## 2023-12-11 DIAGNOSIS — E782 Mixed hyperlipidemia: Secondary | ICD-10-CM | POA: Diagnosis not present

## 2023-12-11 DIAGNOSIS — Z0000 Encounter for general adult medical examination without abnormal findings: Secondary | ICD-10-CM | POA: Diagnosis not present

## 2024-03-18 DIAGNOSIS — I10 Essential (primary) hypertension: Secondary | ICD-10-CM | POA: Diagnosis not present

## 2024-03-18 DIAGNOSIS — E559 Vitamin D deficiency, unspecified: Secondary | ICD-10-CM | POA: Diagnosis not present

## 2024-03-18 DIAGNOSIS — R809 Proteinuria, unspecified: Secondary | ICD-10-CM | POA: Diagnosis not present

## 2024-03-18 DIAGNOSIS — E1165 Type 2 diabetes mellitus with hyperglycemia: Secondary | ICD-10-CM | POA: Diagnosis not present

## 2024-03-18 DIAGNOSIS — E782 Mixed hyperlipidemia: Secondary | ICD-10-CM | POA: Diagnosis not present

## 2024-06-17 DIAGNOSIS — E782 Mixed hyperlipidemia: Secondary | ICD-10-CM | POA: Diagnosis not present
